# Patient Record
Sex: Female | Born: 1955
Health system: Midwestern US, Community
[De-identification: ages and names within clinical notes are randomized; demographics above are authoritative.]

---

## 2015-05-24 IMAGING — RF UGI AIR W/KUB
1 series · 15 of 19 positions shown · non-contrast
Comparison: None.

HISTORY: 58 year-old Female with abdominal pain, nausea with vomiting.
TECHNIQUE: An air contrast upper GI study was performed. Fluoroscopy time: 1.5 minutes.

[Series 1: upper_gi 1 · 15 of 19 slices shown]
[im 1/19]
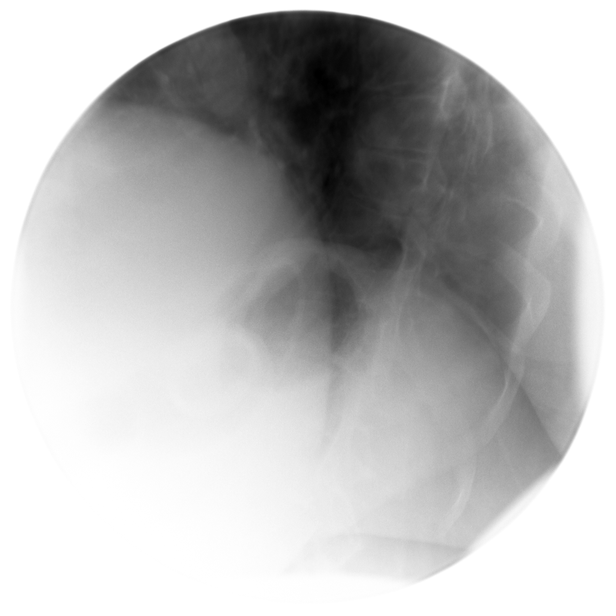
[im 2/19]
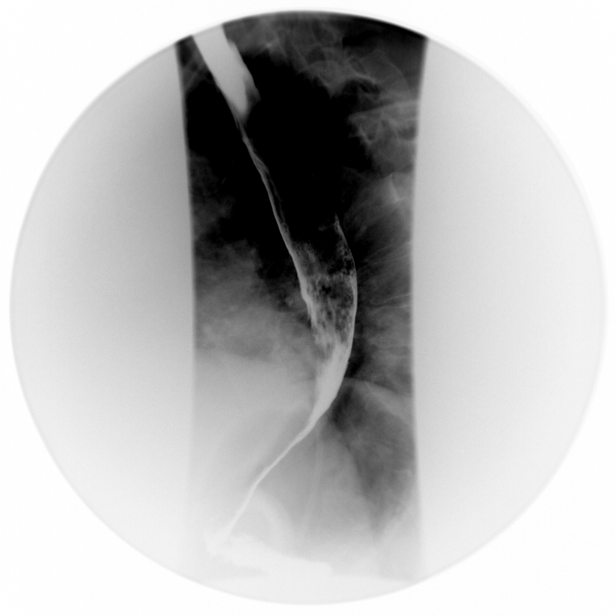
[im 4/19]
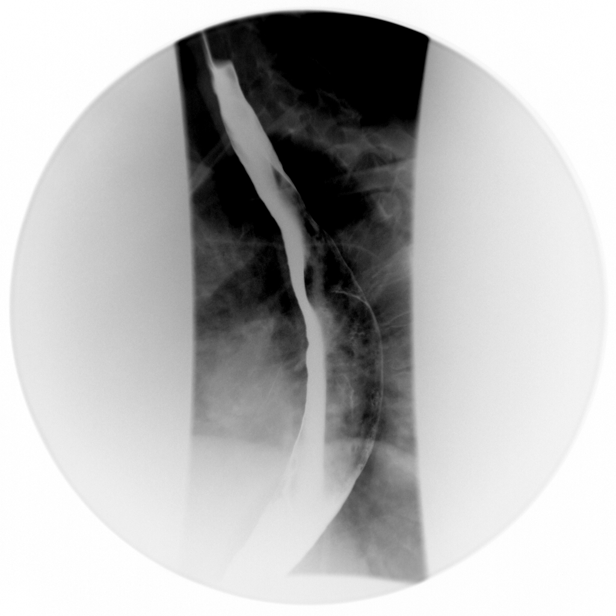
[im 5/19]
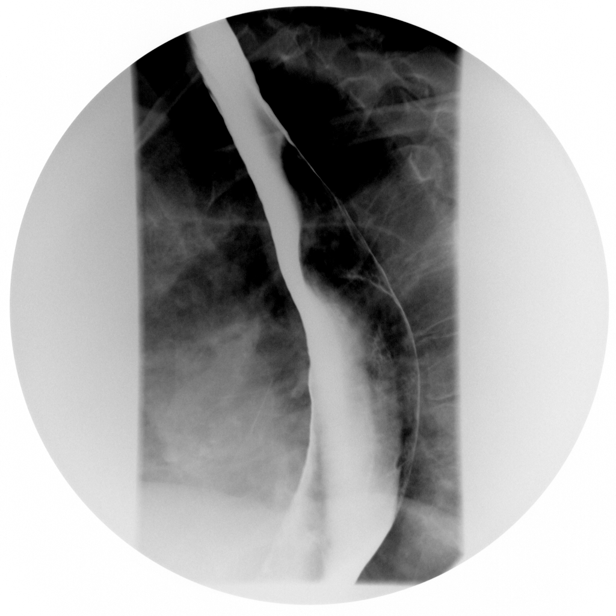
[im 6/19]
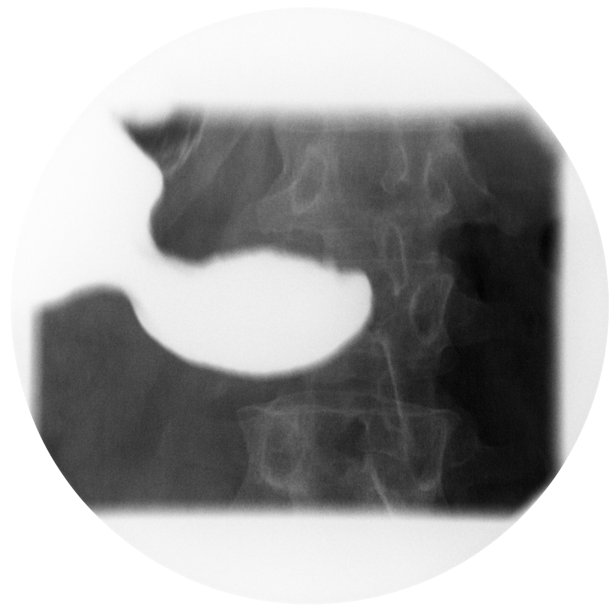
[im 7/19]
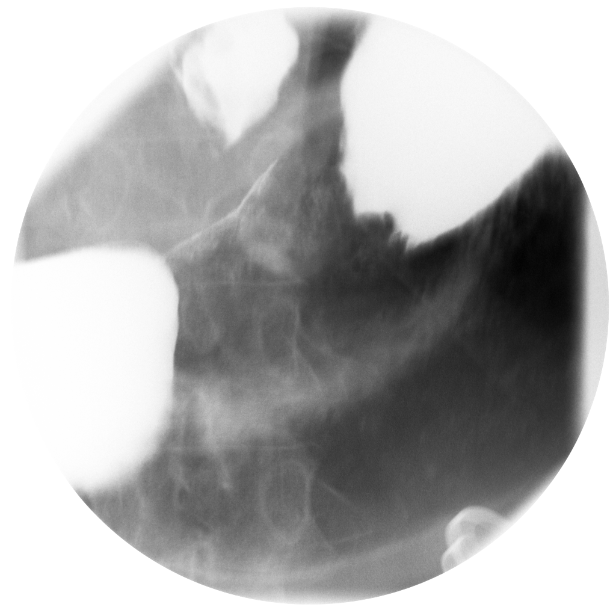
[im 9/19]
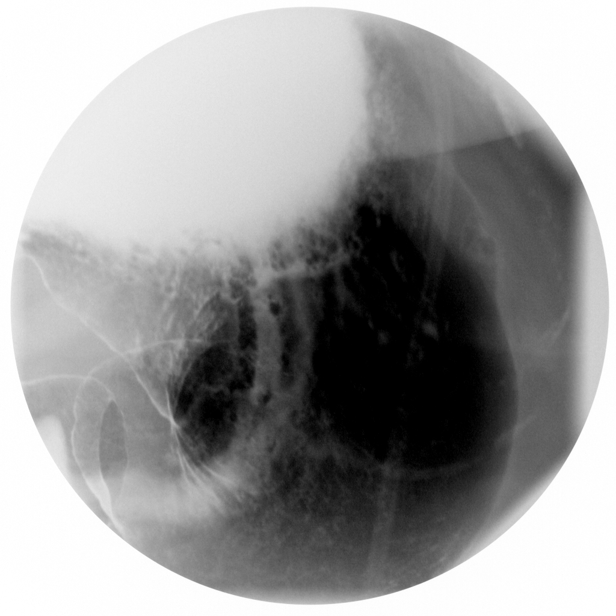
[im 10/19]
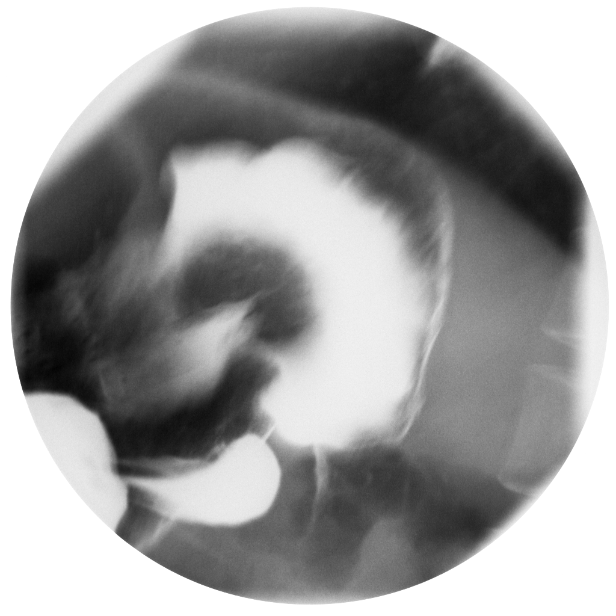
[im 11/19]
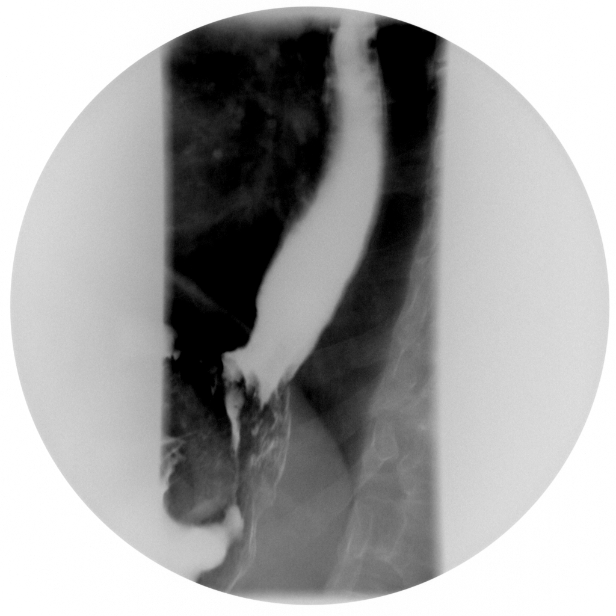
[im 13/19]
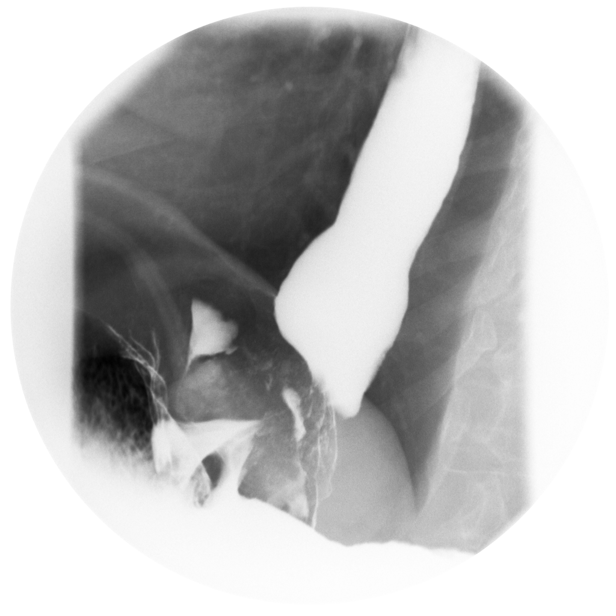
[im 14/19]
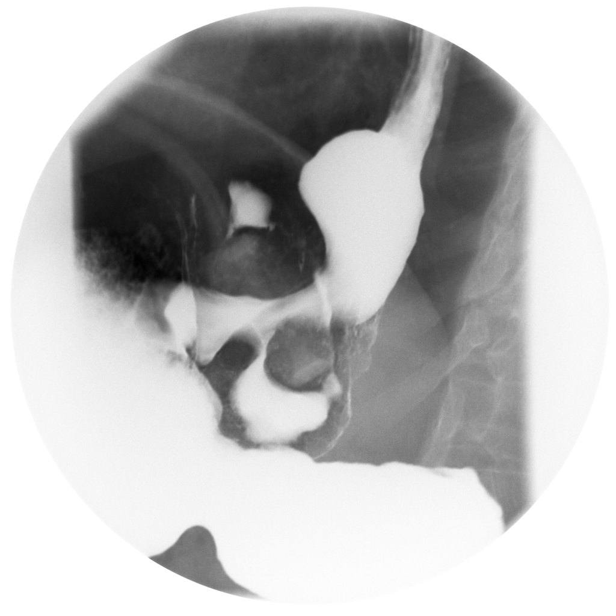
[im 15/19]
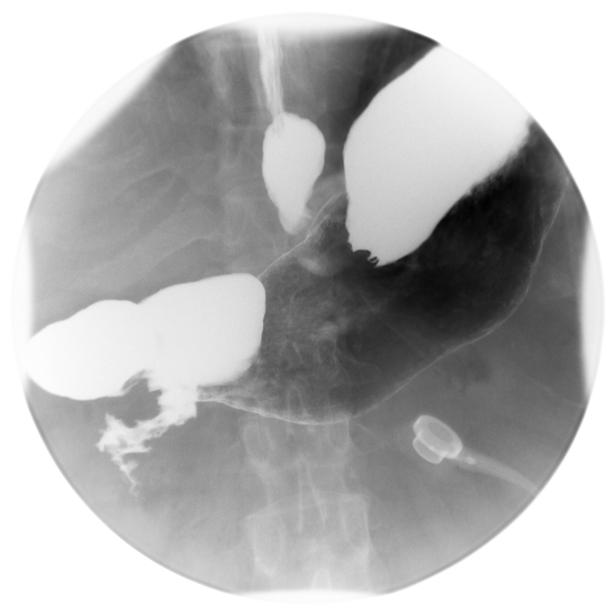
[im 16/19]
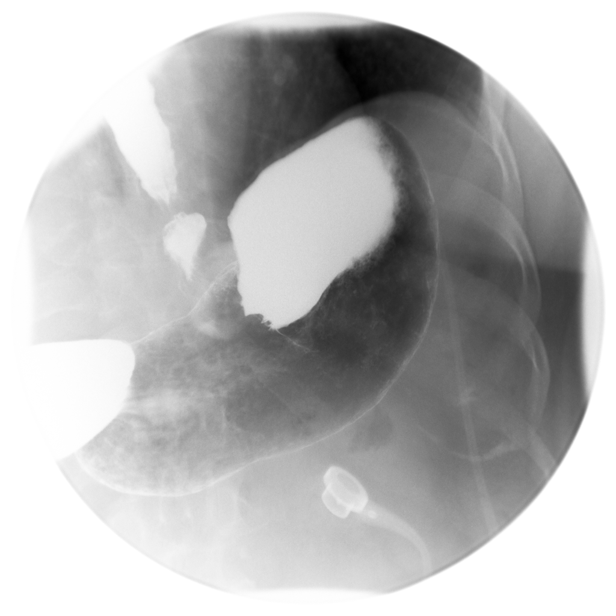
[im 18/19]
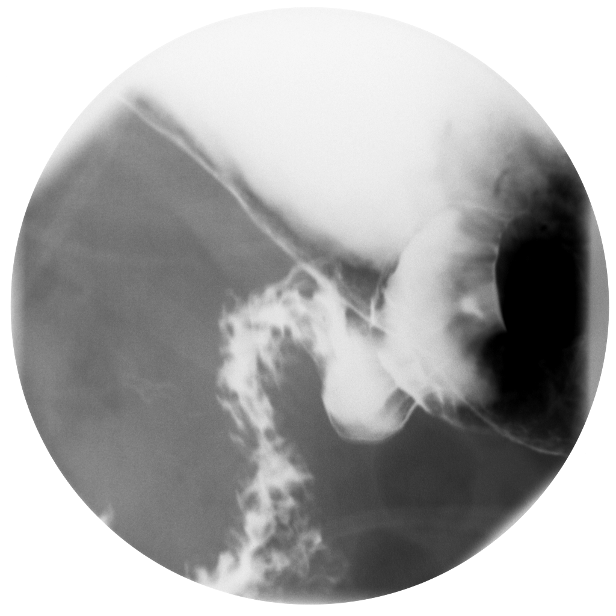
[im 19/19]
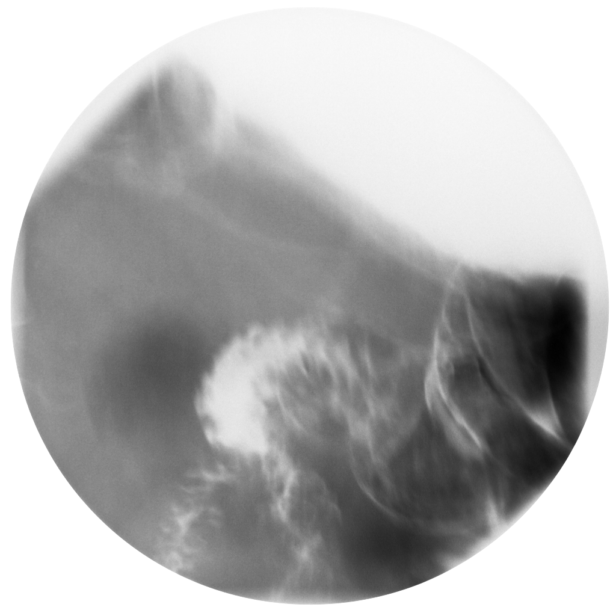

[15 of 19 positions shown; findings below may reference images not displayed]

FINDINGS: The scout film demonstrates patient is status post Lap-Band placement.

An air contrast upper GI was performed. The esophagus shows normal mucosa and peristalsis without mass or ulceration. There is no evident hiatus hernia or gastroesophageal reflux. The stomach shows normal mucosal detail without mass or ulceration. The duodenal bulb and C-loop are unremarkable as are visualized portions of the small bowel.
IMPRESSION: 1. Status post Lap-Band placement.

2. The remaining upper GI study is otherwise unremarkable.

## 2019-06-13 NOTE — Telephone Encounter (Signed)
Left message in response to voice mail at [phone]

## 2021-09-11 IMAGING — MR MRI LSPINE WO CONTRAST
5 series · 48 of 48 positions shown · non-contrast
Comparison: Radiograph lumbar spine 09/11/21

HISTORY: 65 year-old female with lumbar radiculopathy, left leg weakness .
TECHNIQUE: Multiplanar, multisequential MR images of the lumbar spine were obtained without intravenous contrast.

[Series 2: t2_sag · sagittal · 4.0mm · 0.81mm/px · 7 of 17 slices shown]
[im 1/17]
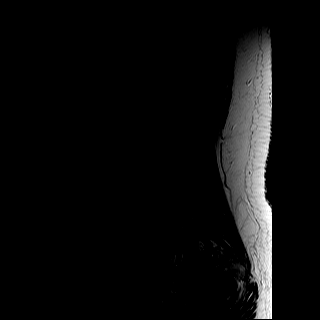
[im 3/17]
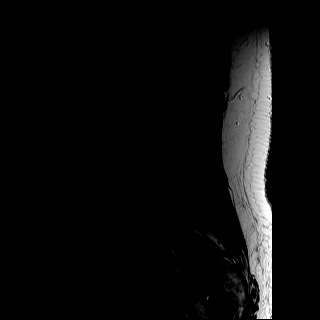
[im 6/17]
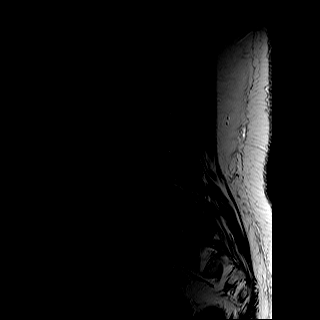
[im 9/17]
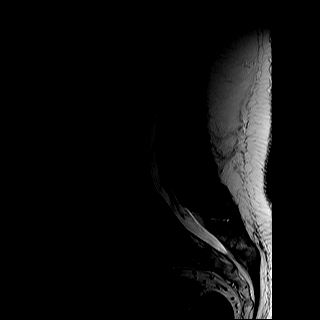
[im 11/17]
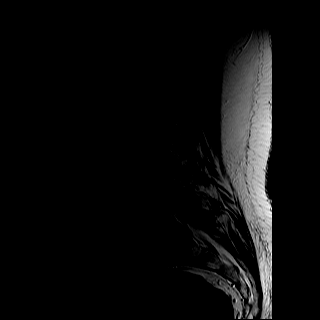
[im 14/17]
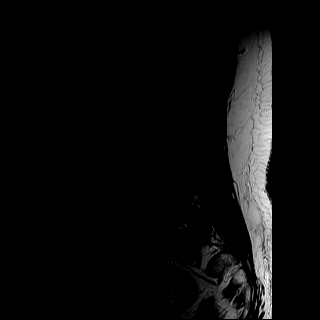
[im 17/17]
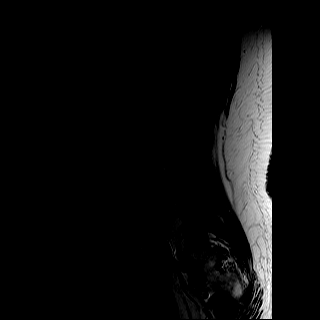

[Series 3: t1_sag · sagittal · 4.0mm · 1.02mm/px · 7 of 17 slices shown]
[im 1/17]
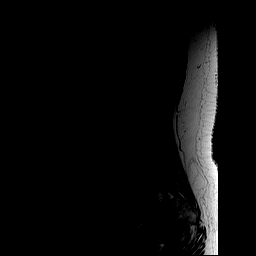
[im 3/17]
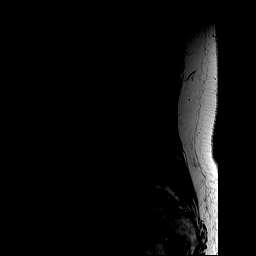
[im 6/17]
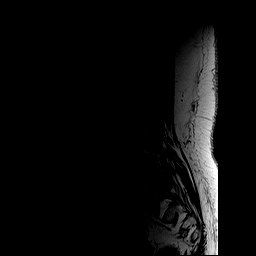
[im 9/17]
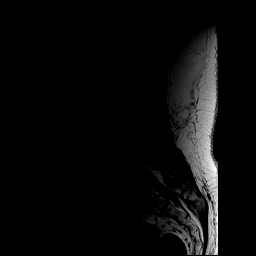
[im 11/17]
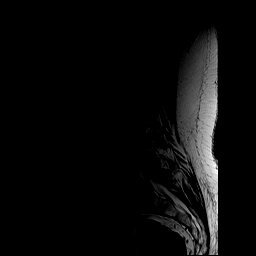
[im 14/17]
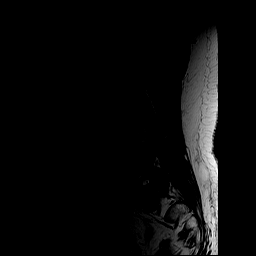
[im 17/17]
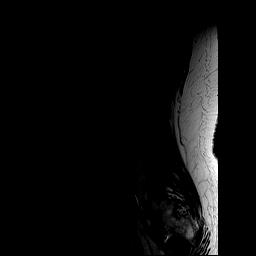

[Series 4: ir_sag · sagittal · 4.0mm · 1.02mm/px · 7 of 17 slices shown]
[im 1/17]
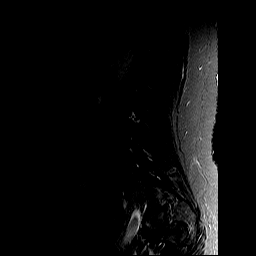
[im 3/17]
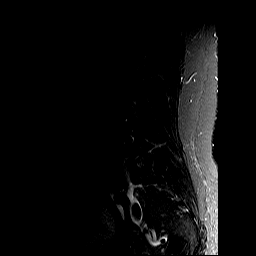
[im 6/17]
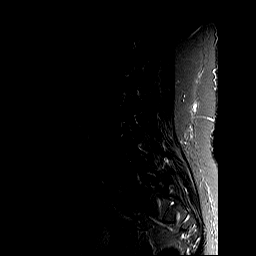
[im 9/17]
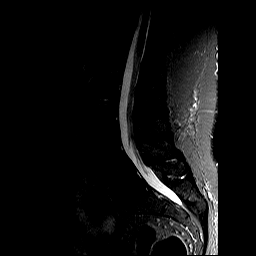
[im 11/17]
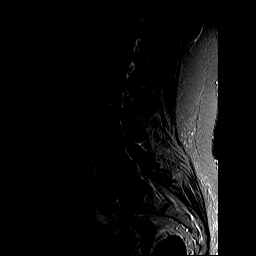
[im 14/17]
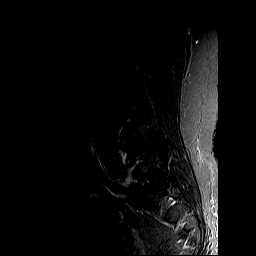
[im 17/17]
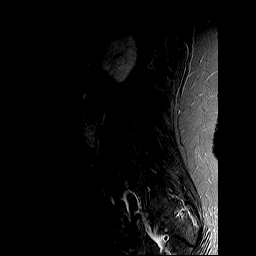

[Series 8: t2_axial · axial · 4.0mm · 0.62mm/px · z∈[-47,+159]mm · 17 of 40 slices shown]
[im 1/40]
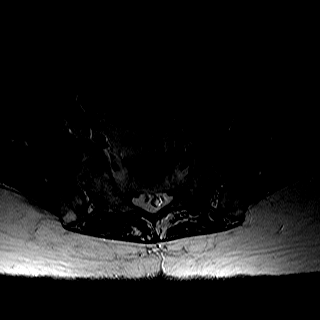
[im 3/40]
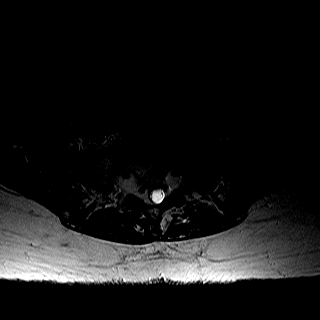
[im 5/40]
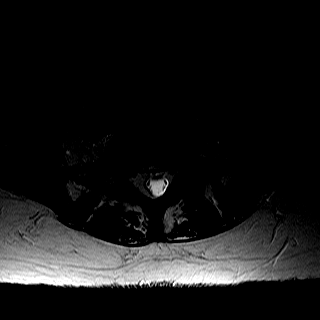
[im 8/40]
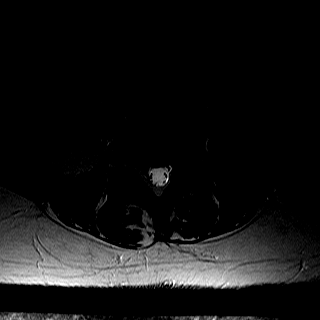
[im 10/40]
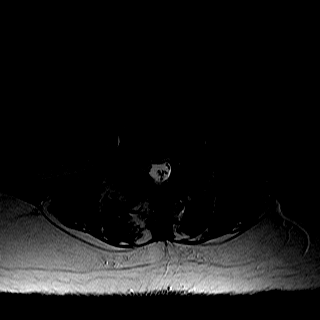
[im 13/40]
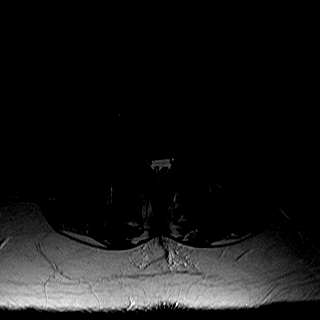
[im 15/40]
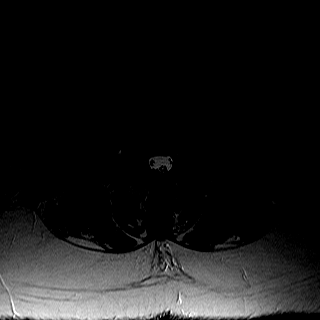
[im 18/40]
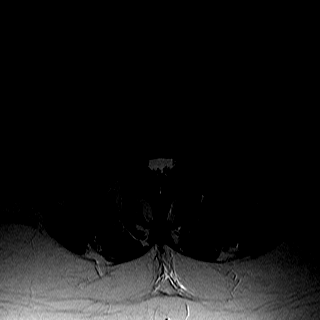
[im 20/40]
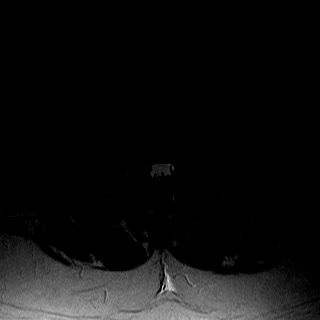
[im 22/40]
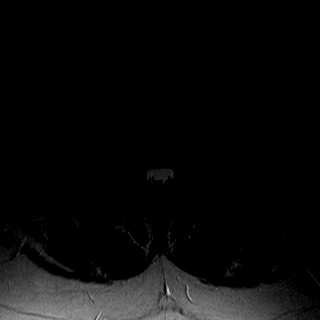
[im 25/40]
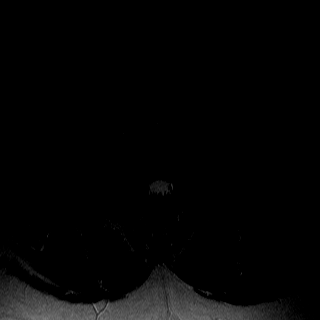
[im 27/40]
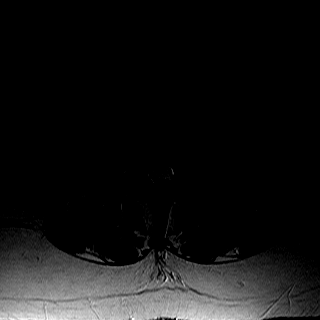
[im 30/40]
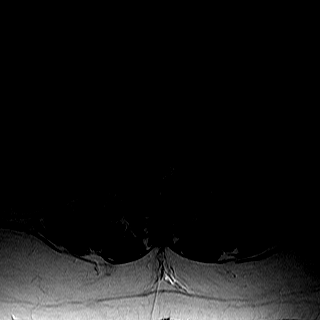
[im 32/40]
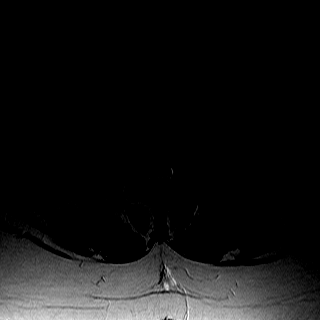
[im 35/40]
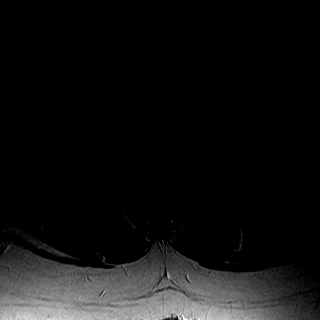
[im 37/40]
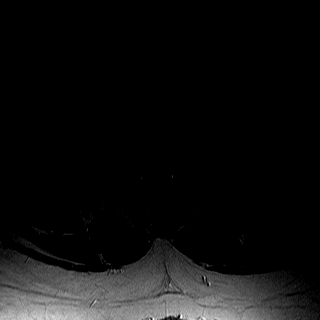
[im 40/40]
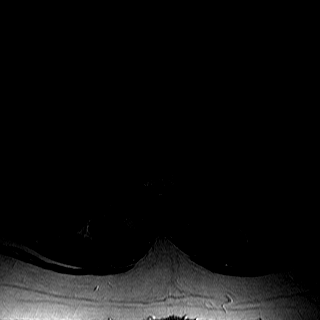

[Series 10: t1_axial_obl · oblique · 4.0mm · 0.78mm/px · 10 of 25 slices shown]
[im 1/25]
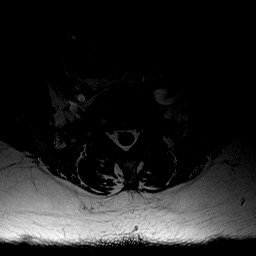
[im 3/25]
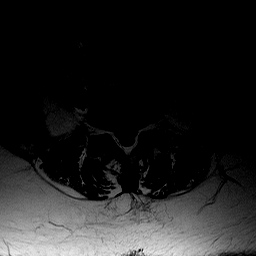
[im 6/25]
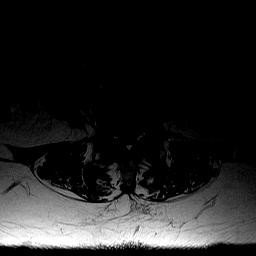
[im 9/25]
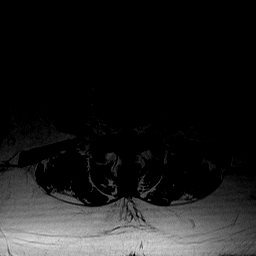
[im 11/25]
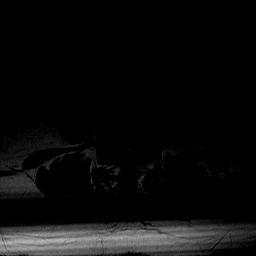
[im 14/25]
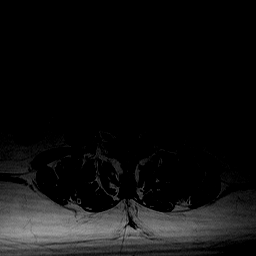
[im 17/25]
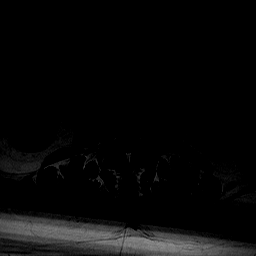
[im 19/25]
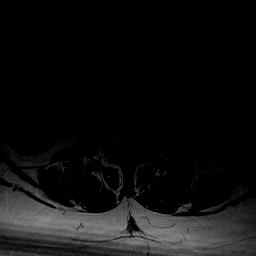
[im 22/25]
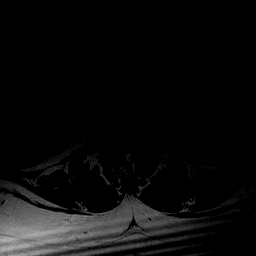
[im 25/25]
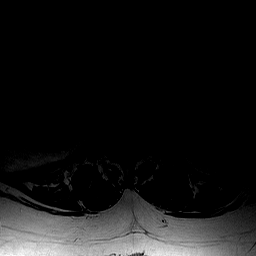

[48 of 48 positions shown; findings below may reference images not displayed]

FINDINGS: Spine labeling: Based on assumption of 5 lumbar type non-rib bearing vertebrae.

Alignment: Grade 1 anterolisthesis of L4 on L5 secondary to facet arthrosis.  

Vertebrae and discs: Multilevel degenerative disc disease and mild discogenic endplate change. 

Marrow: Small T1 hypointense and mild STIR hyperintense lesion in the L4 vertebral body measuring 7 mm, likely an atypical hemangioma or other benign lesion. No suspicious osseous lesion. No acute fracture.

Conus: Ends at a normal level. Unremarkable cauda equina nerve roots.

T12-L1: No significant canal or neural foraminal stenosis.

L1-L2: 9 x 4 mm T1 and T2 isointense signal moderately effacing the fat in the left neural foramen ([DATE]). No significant canal or right neural foraminal stenosis.

L2-L3: Mild facet arthrosis. No significant canal or neural foraminal stenosis.

L3-L4: Moderate ligamentum flavum thickening and mild facet arthrosis. No significant canal or neural foraminal stenosis.

L4-L5: Uncovering of the disc. Minimal disc bulge. Moderate ligamentum flavum thickening and facet arthrosis. Mild canal and mild right neural foraminal stenosis.

L5-S1: Minimal disc bulge. Small posterior annular fissure. Moderate facet arthrosis. No significant canal stenosis. Mild bilateral neural foraminal stenosis.

Mild to moderate degenerative changes in the sacroiliac joints.
IMPRESSION: 1.
Indeterminate 9 x 4 mm isointense lesion in the left L1-L2 neural foramen resulting in moderate neural foraminal stenosis. Differential includes a neurogenic tumor and a complex synovial cyst. Sequestered disc herniation and osteophyte are thought less likely. Consider further evaluation with contrast-enhanced MRI of the lumbar spine.

2.
No other high-grade canal or neural foraminal narrowing.

3.
Grade 1 anterolisthesis of L4 on L5 secondary to facet arthrosis.

4.
Small posterior annular fissure at L5-S1, potential discogenic source of pain.

## 2021-09-11 IMAGING — CR [HOSPITAL] L SPINE
1 series · 2 of 2 positions shown · non-contrast
Comparison: None

HISTORY: 65 year-old female. Radiograph obtained for comparison for concurrent MRI
TECHNIQUE: 2 view radiograph of the lumbar spine.

[Series 1: lat · 0.17mm/px · 2 of 2 slices shown]
[im 1/2]
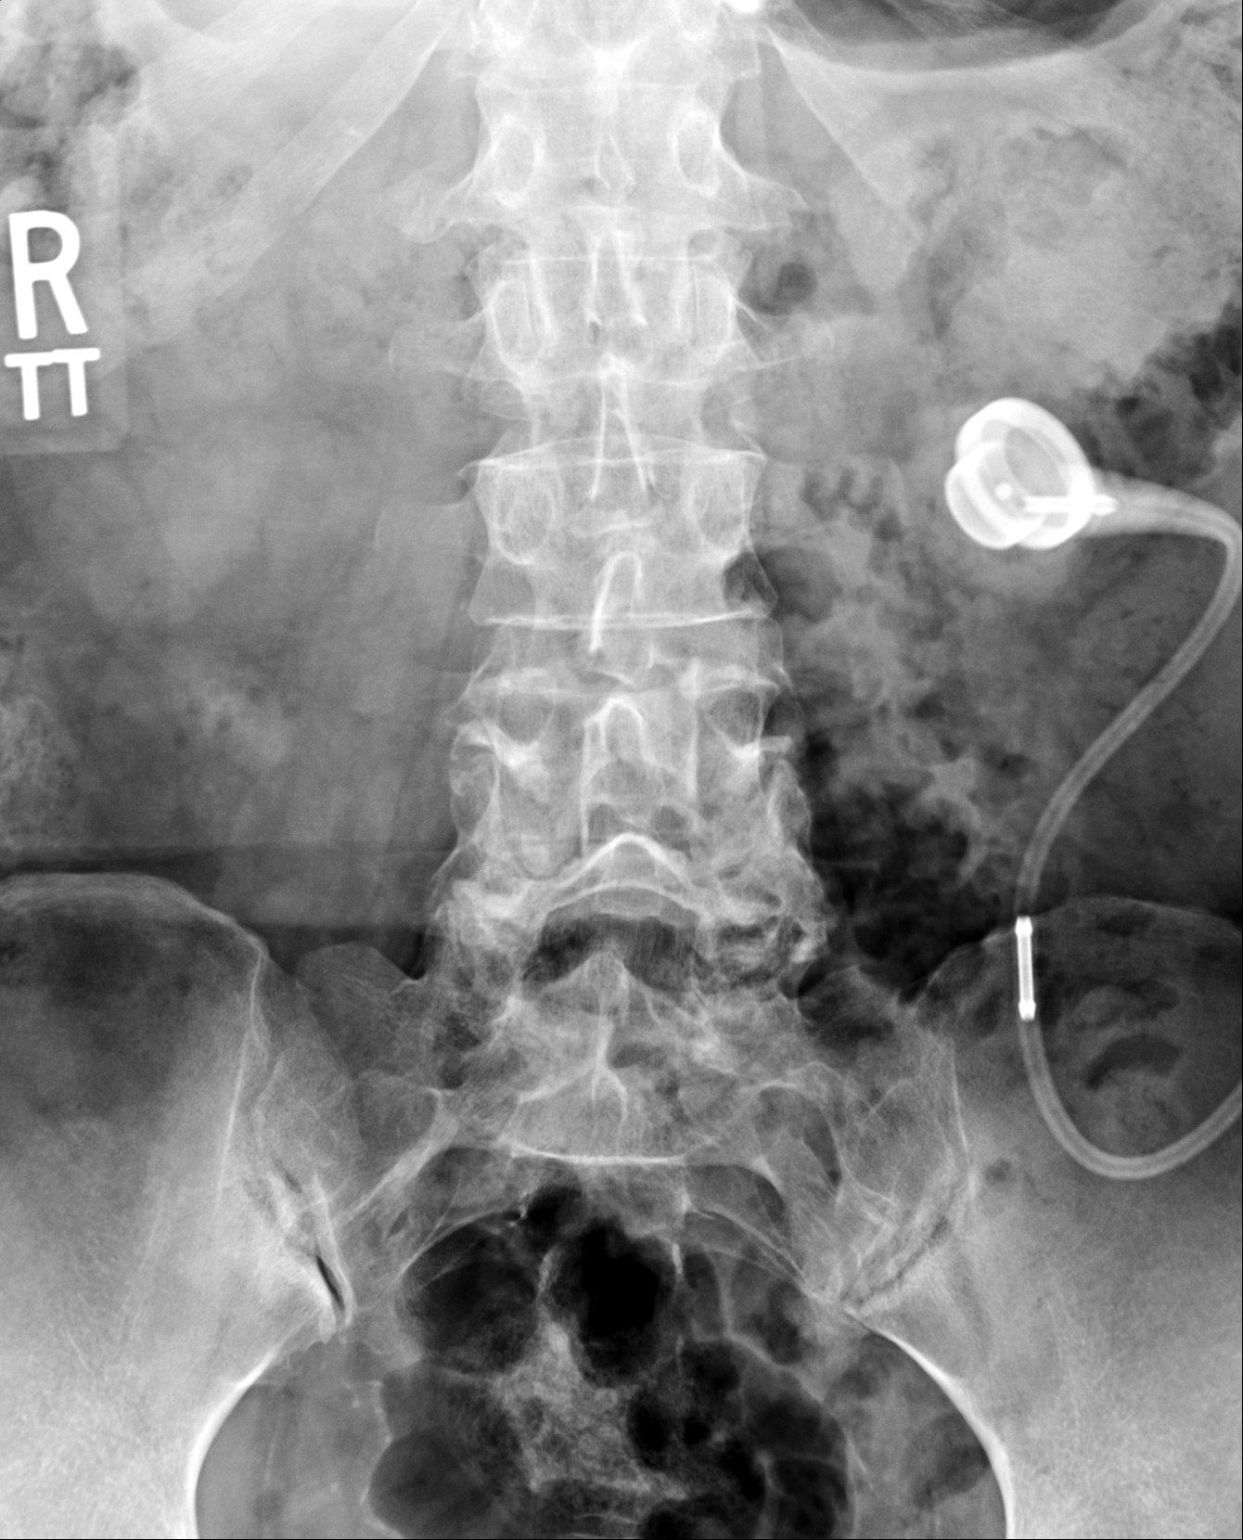
[im 2/2]
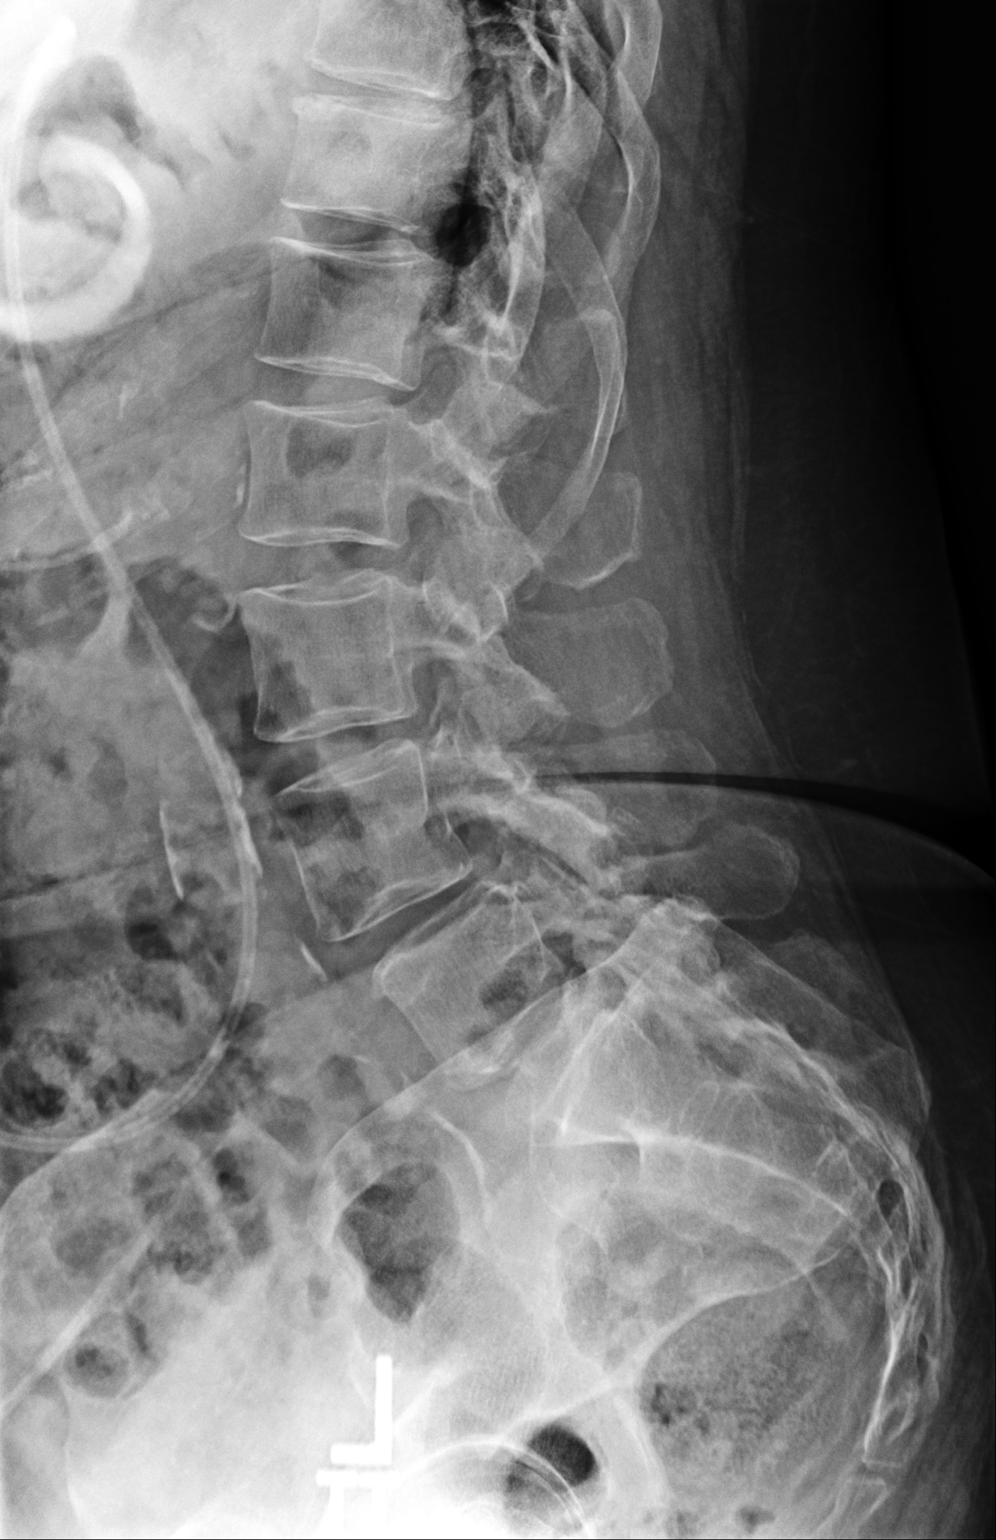

[2 of 2 positions shown; findings below may reference images not displayed]

FINDINGS: Catheter overlies the left abdomen.

Suspected bone demineralization.

Grade 1 anterolisthesis of L4 over L5. Vertebral body heights are maintained. Multilevel degenerative disease, osteophytosis and facet arthropathy.

Moderate bilateral SI joint arthritis.

Scattered vascular calcifications overlie the abdomen.
IMPRESSION: 1.
Suspected bone demineralization.

2.
Multilevel spondylotic changes. Grade 1 anterolisthesis of L4 over L5.

3.
Moderate bilateral SI joint osteoarthritis.

## 2021-10-01 IMAGING — MR MRI LSPINE W CONTRAST
4 of 5 series · 41 of 48 positions shown · IV contrast (prohance)
Comparison: MRI lumbar spine 09/11/21

HISTORY: Radiculopathy
TECHNIQUE: Multiplanar, multisequential MR images of the lumbar spine were obtained without and with intravenous contrast. The patient received an intravenous dose of 15 mL ProHance.

[Series 3: t1_axial_fs · axial · 4.0mm · 0.78mm/px · z∈[-55,+130]mm · 12 of 38 slices shown]
[im 1/38]
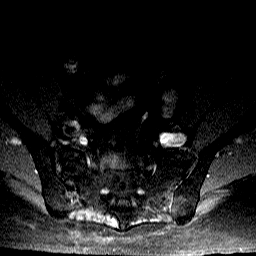
[im 4/38]
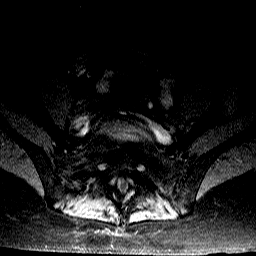
[im 7/38]
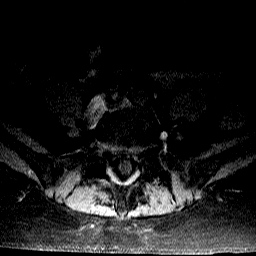
[im 11/38]
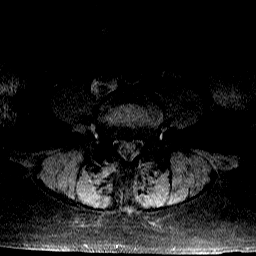
[im 14/38]
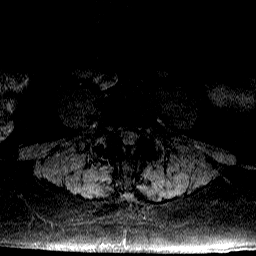
[im 17/38]
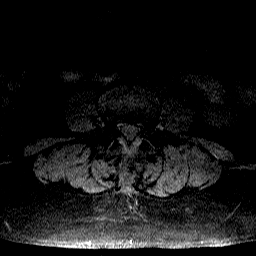
[im 21/38]
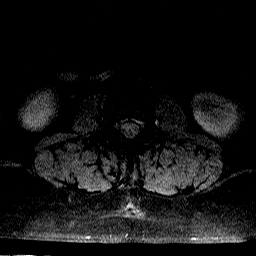
[im 24/38]
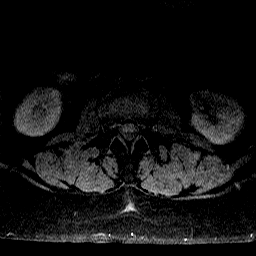
[im 27/38]
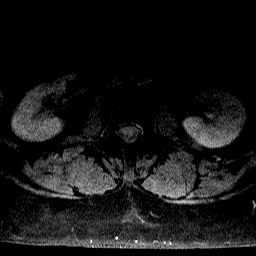
[im 31/38]
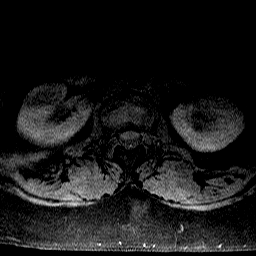
[im 34/38]
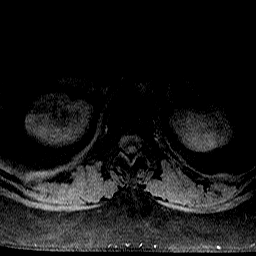
[im 38/38]
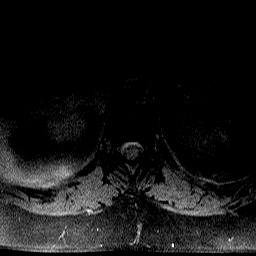

[Series 4: t1_axial_fs +c · axial · 4.0mm · 0.78mm/px · z∈[-55,+130]mm · 12 of 38 slices shown]
[im 1/38]
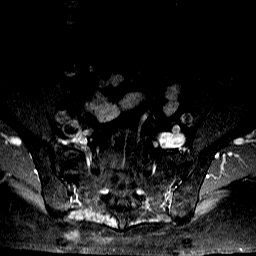
[im 4/38]
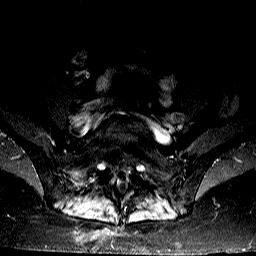
[im 7/38]
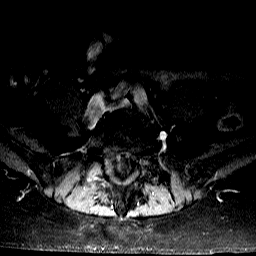
[im 11/38]
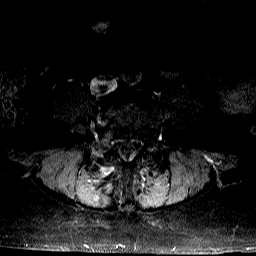
[im 14/38]
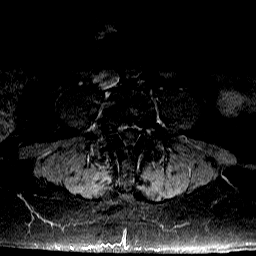
[im 17/38]
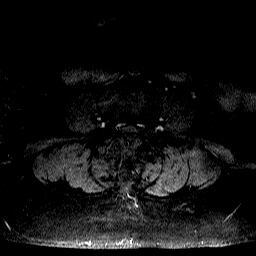
[im 21/38]
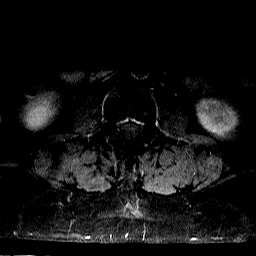
[im 24/38]
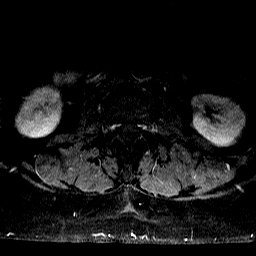
[im 27/38]
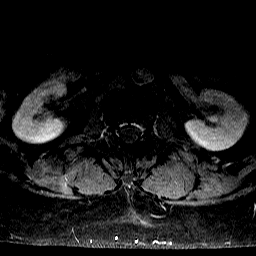
[im 31/38]
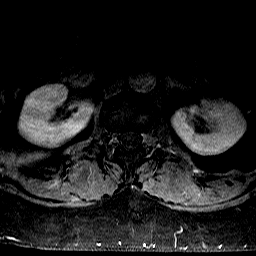
[im 34/38]
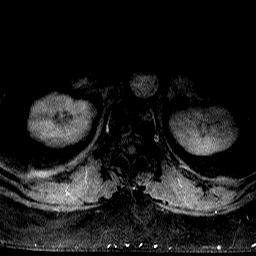
[im 38/38]
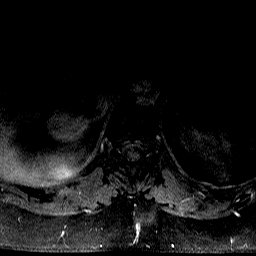

[Series 5: t1_sag_fs +c · sagittal · 4.0mm · 0.98mm/px · 5 of 15 slices shown]
[im 1/15]
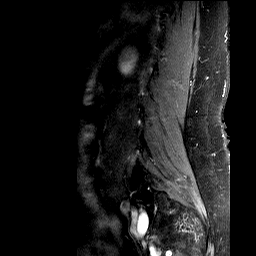
[im 4/15]
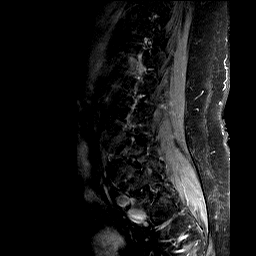
[im 8/15]
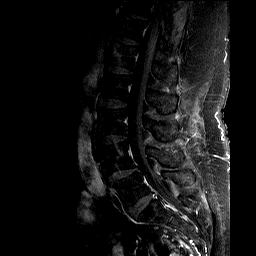
[im 11/15]
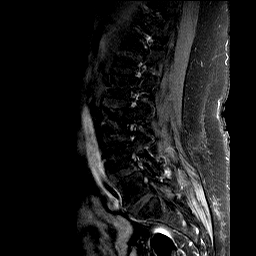
[im 15/15]
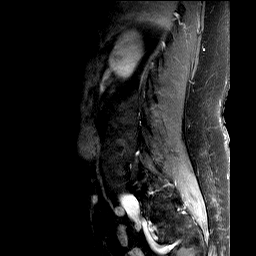

[Series 6: sub_s4-s3_t1_axial_fs+c · axial · 4.0mm · 0.78mm/px · z∈[-55,+130]mm · 12 of 38 slices shown]
[im 1/38]
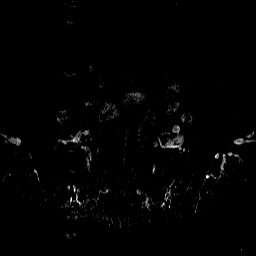
[im 4/38]
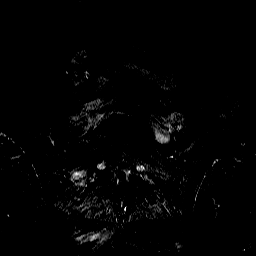
[im 7/38]
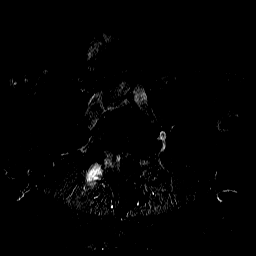
[im 11/38]
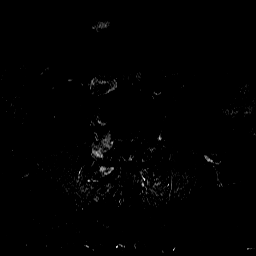
[im 14/38]
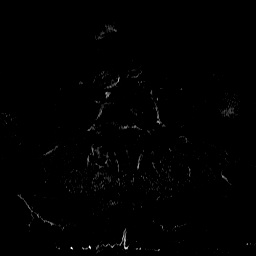
[im 17/38]
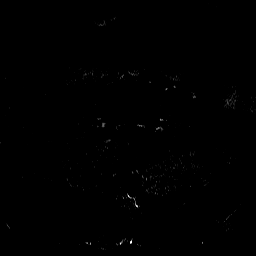
[im 21/38]
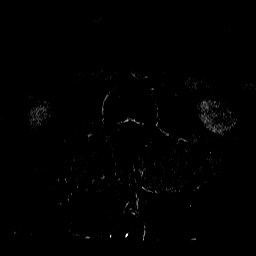
[im 24/38]
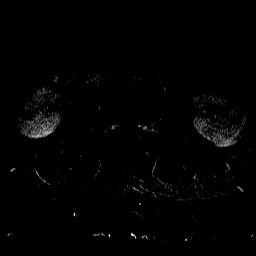
[im 27/38]
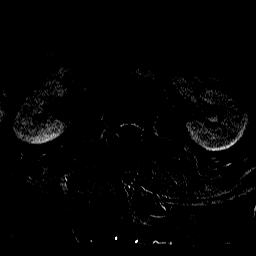
[im 31/38]
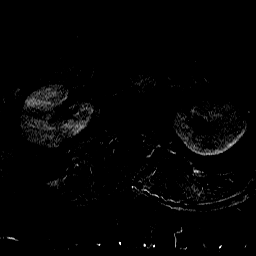
[im 34/38]
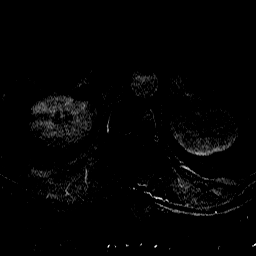
[im 38/38]
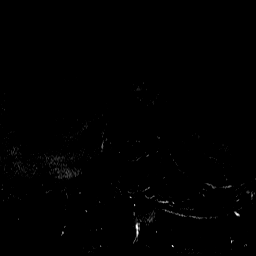

[41 of 48 positions shown; findings below may reference images not displayed]

FINDINGS: Previously described 9 x 4 mm lesion in the left L1-L2 neural foramen demonstrates equivocal/minimal contrast enhancement, difficult to distinguish from the adjacent venous plexus. Size is unchanged compared to the previous exam. No significant degenerative change in the adjacent L1-L2 disc.

Mild enhancement associated with previously described 7 mm T1 hypointense lesion in the L4 vertebral body. This likely represents an atypical hemangioma or focal marrow hyperplasia. Enhancement associated with posterior annular fissure at L5-S1. Mild degenerative enhancement about the posterior elements of L4 on L5. No other abnormal contrast enhancement.

Degenerative changes are are not significantly changed and better assessed on the previous exam.
IMPRESSION: Probable sequestered disc herniation in the left L1-L2 neural foramen, similar in size to the previous exam. Lack of significant degeneration in the adjacent L1-L2 disc is unusual. 

A neurogenic tumor is thought less likely because of lack of significant enhancement and T2 hypointense signal (on the previous exam).

## 2021-11-13 IMAGING — US DOP ARTERIAL LWR EXT BIL
1 series · 13 of 16 positions shown · non-contrast
Comparison: None.

HISTORY: 65 years-old Female with Atherosclerosis.
TECHNIQUE: Multiple images from a real-time, duplex and color Doppler ultrasound of the arterial system of right and left leg are performed. Velocities given below are peak systolic.

[Series 1: dop arterial lwr ext bil · arterial · 13 of 66 slices shown]
[im 1/66]
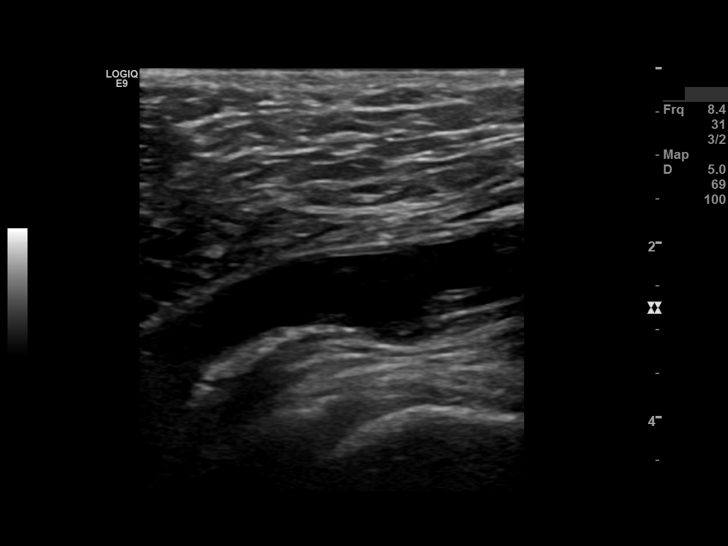
[im 5/66]
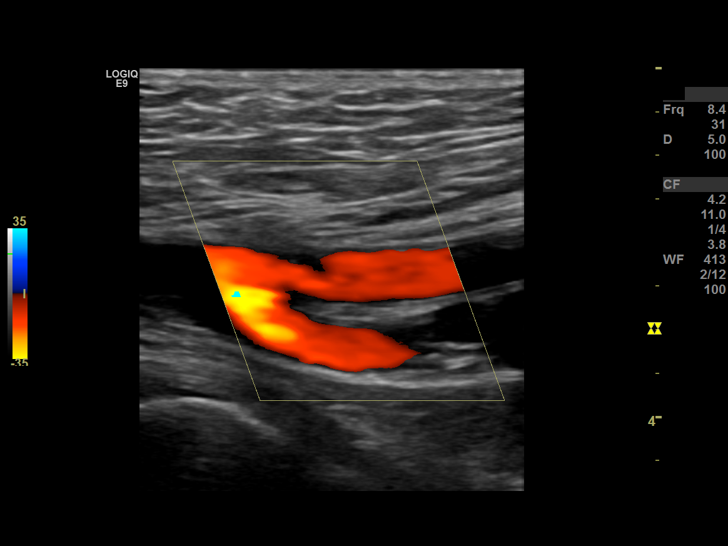
[im 14/66]
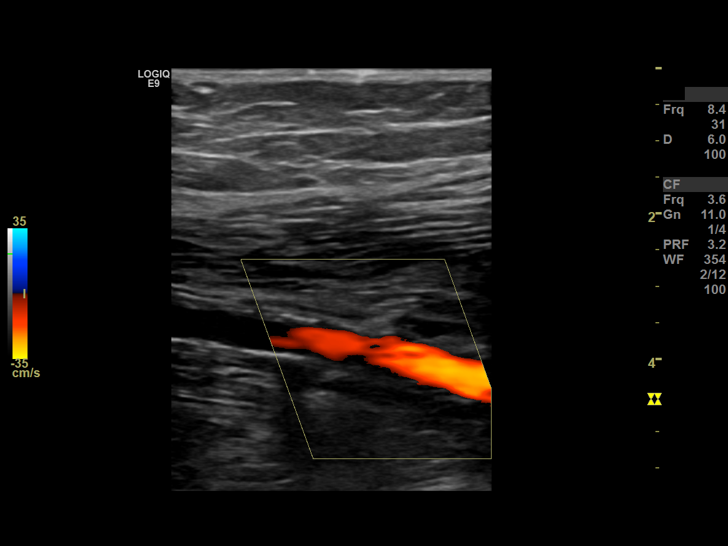
[im 18/66]
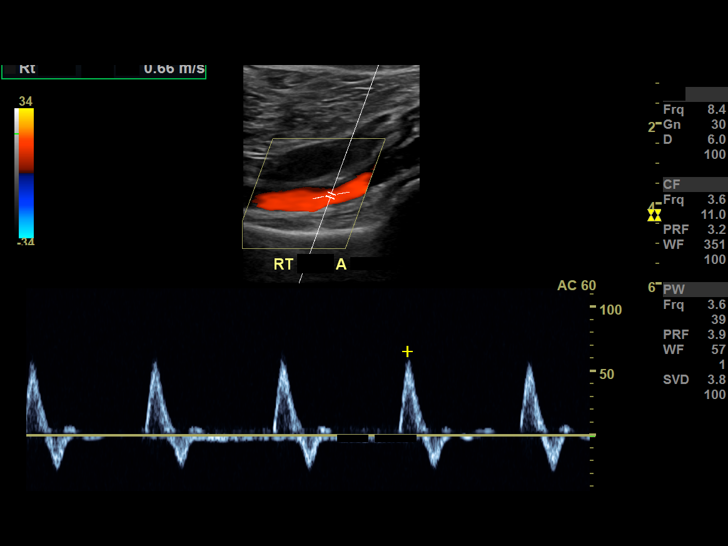
[im 22/66]
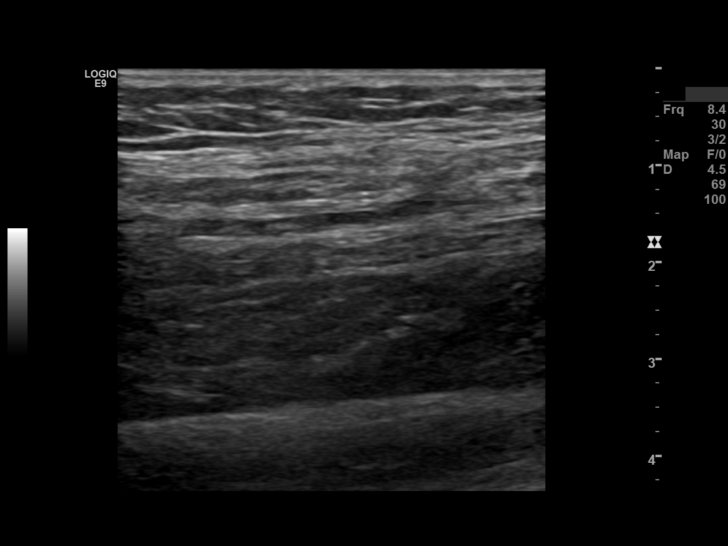
[im 27/66]
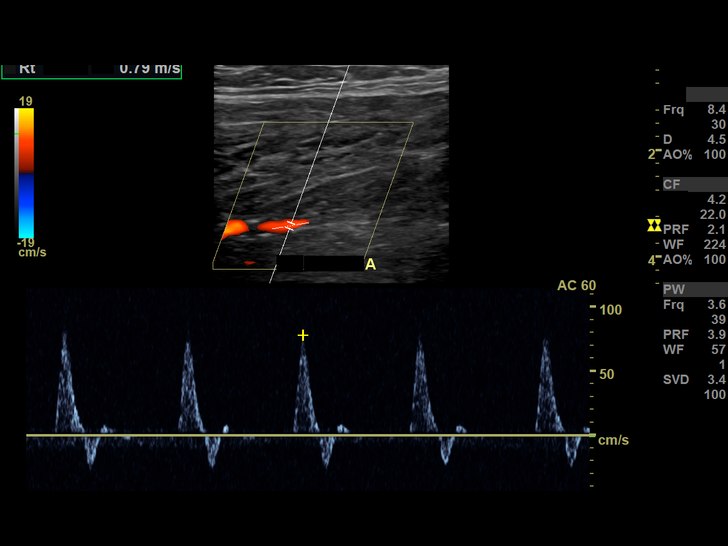
[im 35/66]
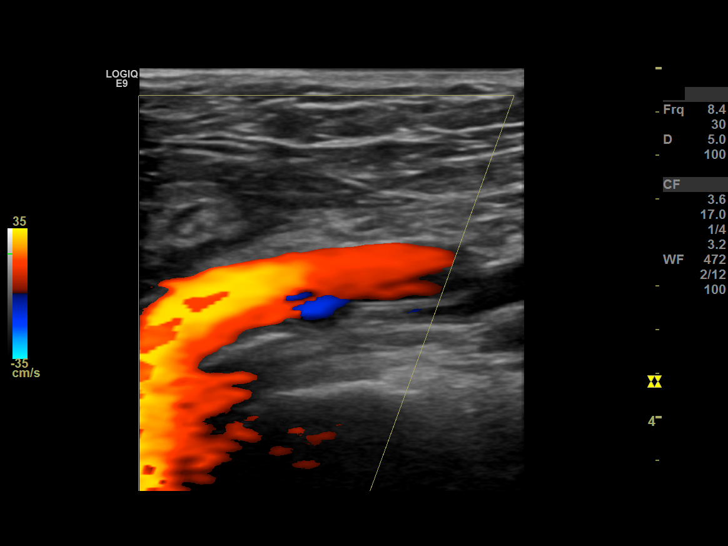
[im 40/66]
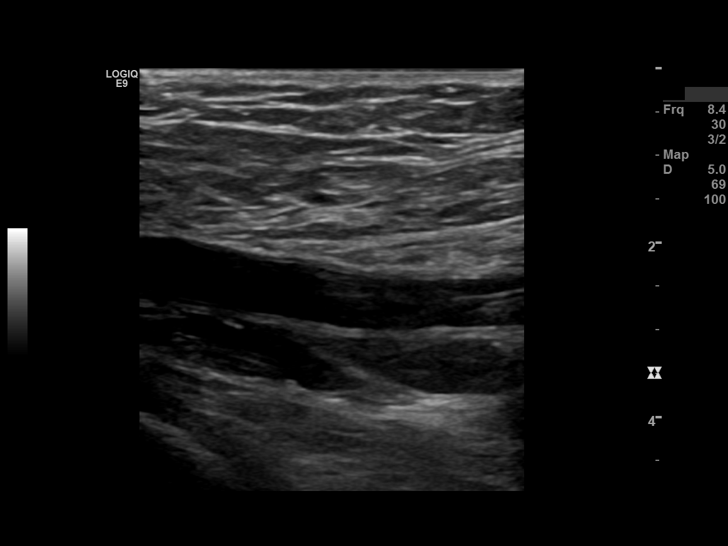
[im 44/66]
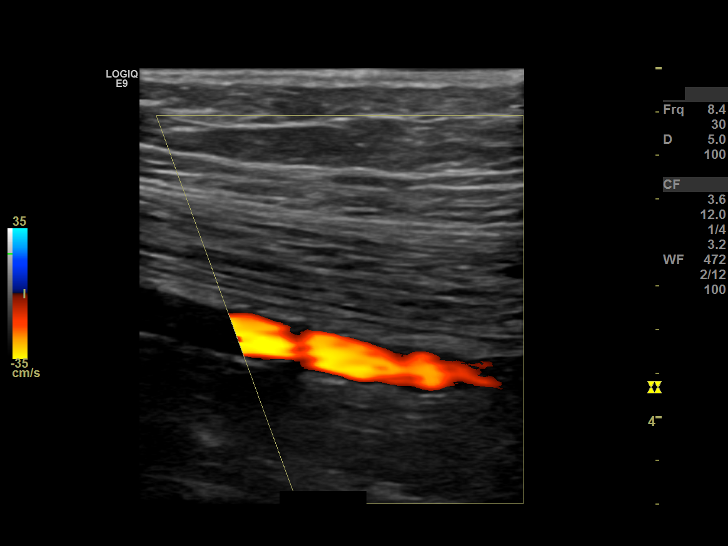
[im 48/66]
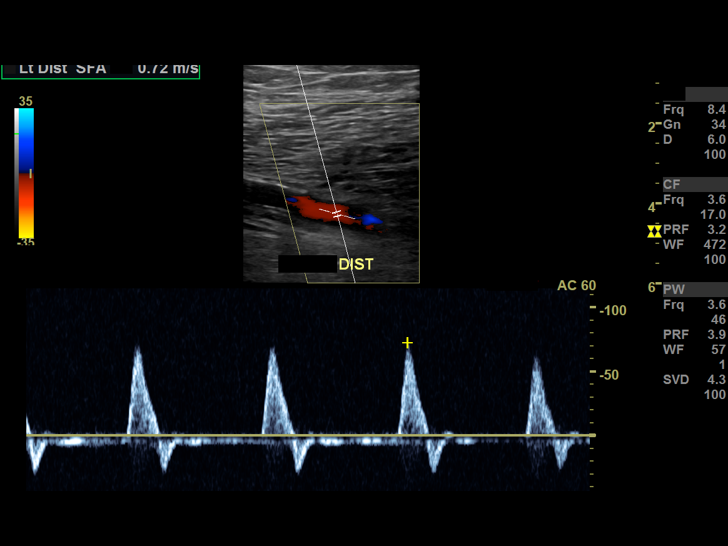
[im 53/66]
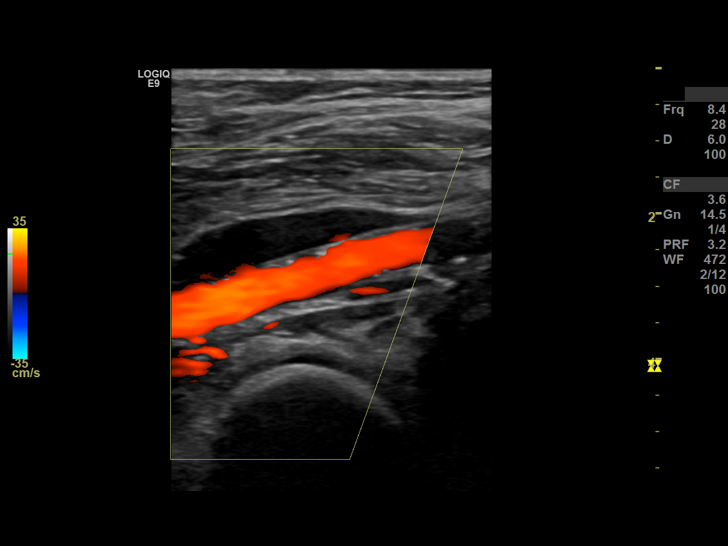
[im 61/66]
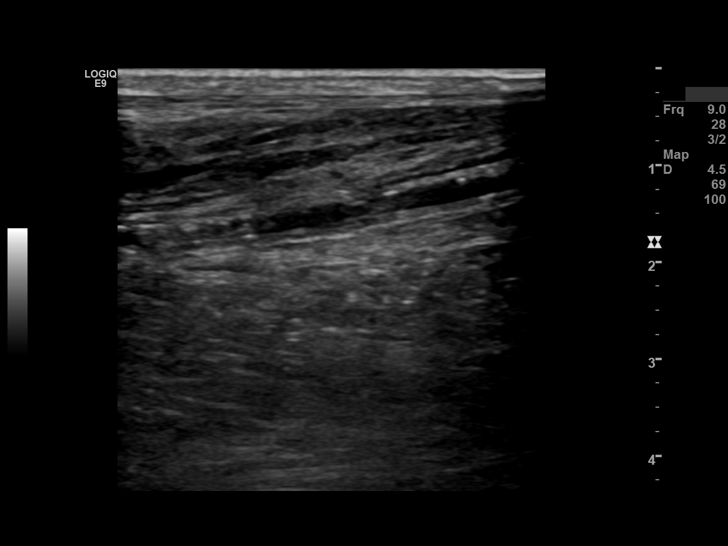
[im 66/66]
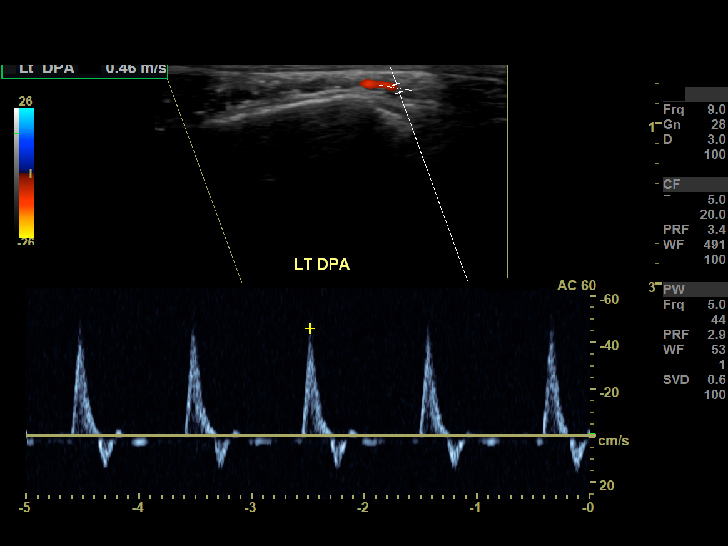

[13 of 16 positions shown; findings below may reference images not displayed]

FINDINGS: The right leg arterial system demonstrates the following waveforms and peak systolic velocities (m/s):

Common femoral artery: TRIPHASIC, 0.94 (m/s)

Profunda femoral artery: TRIPHASIC, 0.70 (m/s)

Superficial femoral artery proximal: TRIPHASIC, 0.66 (m/s)

Superficial femoral artery mid: TRIPHASIC, 0.90 (m/s)

Superficial femoral artery distal: TRIPHASIC, 0.84 (m/s)

Popliteal artery proximal: TRIPHASIC, 0.66 (m/s)

Popliteal artery distal: TRIPHASIC, 0.51 (m/s)

Anterior tibial artery: TRIPHASIC,  1.12 (m/s)

Posterior tibial artery: TRIPHASIC, 0.65 (m/s)

Peroneal artery: TRIPHASIC, 0.79 (m/s)

Dorsalis pedis artery: TRIPHASIC, 0.45 (m/s)

The left leg arterial system demonstrates the following waveforms and peak systolic velocities (m/s):

Common femoral artery: TRIPHASIC, 0.97 (m/s)

Profunda femoral artery: BIPHASIC, 0.54 (m/s)

Superficial femoral artery proximal: TRIPHASIC, 0.65 (m/s)

Superficial femoral artery mid: TRIPHASIC, 1.02 (m/s)

Superficial femoral artery distal: TRIPHASIC, 0.72 (m/s)

Popliteal artery proximal: TRIPHASIC, 0.54 (m/s)

Popliteal artery distal: TRIPHASIC,  0.54  (m/s)

Anterior tibial artery: TRIPHASIC. 0.74 (m/s)

Posterior tibial artery: TRIPHASIC, 0.72 (m/s)

Peroneal artery: TRIPHASIC, 0.80 (m/s)

Dorsalis pedis artery: TRIPHASIC, 0.46 (m/s)

Cardiac rhythm: REGULAR.
IMPRESSION: Negative bilateral lower extremity arterial Doppler for hemodynamically significant stenosis or occlusive vascular disease.
